# Patient Record
Sex: Male | Born: 1970
Health system: Southern US, Community
[De-identification: ages and names within clinical notes are randomized; demographics above are authoritative.]

## PROBLEM LIST (undated history)

## (undated) DIAGNOSIS — G709 Myoneural disorder, unspecified: Secondary | ICD-10-CM

## (undated) DIAGNOSIS — R569 Unspecified convulsions: Secondary | ICD-10-CM

## (undated) DIAGNOSIS — K635 Polyp of colon: Secondary | ICD-10-CM

## (undated) DIAGNOSIS — I10 Essential (primary) hypertension: Secondary | ICD-10-CM

## (undated) DIAGNOSIS — K219 Gastro-esophageal reflux disease without esophagitis: Secondary | ICD-10-CM

## (undated) DIAGNOSIS — M1990 Unspecified osteoarthritis, unspecified site: Secondary | ICD-10-CM

## (undated) DIAGNOSIS — S069X9A Unspecified intracranial injury with loss of consciousness of unspecified duration, initial encounter: Secondary | ICD-10-CM

## (undated) HISTORY — DX: Essential (primary) hypertension: I10

## (undated) HISTORY — DX: Polyp of colon: K63.5

## (undated) HISTORY — DX: Unspecified convulsions: R56.9

## (undated) HISTORY — DX: Gastro-esophageal reflux disease without esophagitis: K21.9

## (undated) HISTORY — DX: Unspecified intracranial injury with loss of consciousness of unspecified duration, initial encounter: S06.9X9A

## (undated) HISTORY — DX: Unspecified osteoarthritis, unspecified site: M19.90

## (undated) HISTORY — PX: BRAIN SURGERY: SHX531

---

## 1999-04-11 ENCOUNTER — Encounter: Payer: Self-pay | Admitting: Emergency Medicine

## 1999-04-11 ENCOUNTER — Inpatient Hospital Stay (HOSPITAL_COMMUNITY): Admission: EM | Admit: 1999-04-11 | Discharge: 1999-04-13 | Payer: Self-pay | Admitting: Emergency Medicine

## 1999-10-01 ENCOUNTER — Inpatient Hospital Stay (HOSPITAL_COMMUNITY)
Admission: EM | Admit: 1999-10-01 | Discharge: 1999-10-04 | Payer: Self-pay | Admitting: Physical Medicine and Rehabilitation

## 1999-10-01 ENCOUNTER — Encounter: Payer: Self-pay | Admitting: Physical Medicine and Rehabilitation

## 1999-10-04 ENCOUNTER — Inpatient Hospital Stay (HOSPITAL_COMMUNITY): Admission: AD | Admit: 1999-10-04 | Discharge: 1999-10-06 | Payer: Self-pay | Admitting: Pulmonary Disease

## 1999-10-04 ENCOUNTER — Encounter (INDEPENDENT_AMBULATORY_CARE_PROVIDER_SITE_OTHER): Payer: Self-pay | Admitting: *Deleted

## 1999-10-05 ENCOUNTER — Encounter: Payer: Self-pay | Admitting: Pulmonary Disease

## 1999-10-06 ENCOUNTER — Inpatient Hospital Stay (HOSPITAL_COMMUNITY)
Admission: AD | Admit: 1999-10-06 | Discharge: 1999-11-24 | Payer: Self-pay | Admitting: Physical Medicine and Rehabilitation

## 1999-10-07 ENCOUNTER — Encounter: Payer: Self-pay | Admitting: Physical Medicine & Rehabilitation

## 1999-10-15 ENCOUNTER — Encounter: Payer: Self-pay | Admitting: Physical Medicine and Rehabilitation

## 1999-10-18 ENCOUNTER — Encounter: Payer: Self-pay | Admitting: Physical Medicine and Rehabilitation

## 1999-10-22 ENCOUNTER — Encounter: Payer: Self-pay | Admitting: Physical Medicine and Rehabilitation
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
## 2017-05-12 ENCOUNTER — Ambulatory Visit (INDEPENDENT_AMBULATORY_CARE_PROVIDER_SITE_OTHER): Payer: Medicare Other | Admitting: General Surgery

## 2017-05-12 ENCOUNTER — Other Ambulatory Visit: Payer: Self-pay | Admitting: General Surgery

## 2017-05-12 ENCOUNTER — Encounter: Payer: Self-pay | Admitting: General Surgery

## 2017-05-12 VITALS — BP 162/89 | HR 76 | Temp 97.8°F | Resp 18 | Ht 73.0 in | Wt 230.0 lb

## 2017-05-12 DIAGNOSIS — D171 Benign lipomatous neoplasm of skin and subcutaneous tissue of trunk: Secondary | ICD-10-CM

## 2017-05-12 DIAGNOSIS — D179 Benign lipomatous neoplasm, unspecified: Secondary | ICD-10-CM

## 2017-05-12 NOTE — Progress Notes (Signed)
Michael Poole; [id number]; 1970-10-22   HPI Patient is a 46 year old white male who was referred by care of by Dr. Quillian Quince for evaluation and treatment of an enlarging mass on the top part of his back.  He states is been present for many years, but has recently increased in size and is causing him discomfort.  He was making difficult for him to lie on his back.  He currently has no pain.  He has noticed growth over the past year. No past medical history on file.  No past surgical history on file.  No family history on file.  No current outpatient prescriptions on file prior to visit.   No current facility-administered medications on file prior to visit.     Not on File  History  Alcohol use Not on file    History  Smoking Status  . Former Smoker  . Quit date: 05/10/2017  Smokeless Tobacco  . Never Used    Review of Systems  Constitutional: Negative.   HENT: Negative.   Eyes: Positive for blurred vision.  Respiratory: Negative.   Cardiovascular: Negative.   Gastrointestinal: Negative.   Genitourinary: Negative.   Musculoskeletal: Negative.   Skin: Negative.   Neurological: Negative.   Endo/Heme/Allergies: Negative.   Psychiatric/Behavioral: Negative.     Objective   Vitals:   05/12/17 1048  BP: (!) 162/89  Pulse: 76  Resp: 18  Temp: 97.8 F (36.6 C)    Physical Exam  Constitutional: He is oriented to person, place, and time and well-developed, well-nourished, and in no distress.  HENT:  Head: Normocephalic.  Cardiovascular: Normal rate, regular rhythm and normal heart sounds.   No murmur heard. Pulmonary/Chest: Effort normal and breath sounds normal. He has no wheezes. He has no rales.  Neurological: He is alert and oriented to person, place, and time.  Skin:  15 x 18 cm ovoid, rubbery, subcutaneous mass noted along the mid upper back, chest inferior to the neck.  It is nontender.  No drainage noted.  Vitals reviewed.  Primary care notes  reviewed. Assessment  Subcutaneous mass, back Plan     We will get an ultrasound of the mass to make sure it is just subcutaneous in nature.  He will return to go over results and surgical options.

## 2017-05-17 ENCOUNTER — Ambulatory Visit (HOSPITAL_COMMUNITY)
Admission: RE | Admit: 2017-05-17 | Discharge: 2017-05-17 | Disposition: A | Discharge status: Home / Self Care | Payer: Medicare Other | Source: Ambulatory Visit | Attending: General Surgery | Admitting: General Surgery

## 2017-05-17 DIAGNOSIS — D171 Benign lipomatous neoplasm of skin and subcutaneous tissue of trunk: Secondary | ICD-10-CM | POA: Diagnosis not present

## 2017-05-17 DIAGNOSIS — D179 Benign lipomatous neoplasm, unspecified: Secondary | ICD-10-CM

## 2017-05-19 ENCOUNTER — Encounter: Payer: Self-pay | Admitting: General Surgery

## 2017-05-19 ENCOUNTER — Ambulatory Visit (INDEPENDENT_AMBULATORY_CARE_PROVIDER_SITE_OTHER): Payer: Medicare Other | Admitting: General Surgery

## 2017-05-19 VITALS — BP 152/85 | HR 83 | Temp 97.7°F | Resp 18 | Ht 73.0 in | Wt 228.0 lb

## 2017-05-19 DIAGNOSIS — D171 Benign lipomatous neoplasm of skin and subcutaneous tissue of trunk: Secondary | ICD-10-CM

## 2017-05-19 NOTE — Progress Notes (Signed)
Subjective:     Michael Poole  Patient back for review of ultrasound. Objective:    BP (!) 152/85   Pulse 83   Temp 97.7 F (36.5 C)   Resp 18   Ht 6\' 1"  (1.854 m)   Wt 228 lb (103.4 kg)   BMI 30.08 kg/m   General:  alert, cooperative and no distress  Ultrasound shows the mass to be contained in the subcutaneous tissue. There does not appear to be extension into the muscle.     Assessment:    Large lipomatous mass, back and base of neck    Plan:   I am referring patient to [facility] as this is outside my realm of expertise in dealing with it at [facility]. Family understand and agreed. Will send records.

## 2017-05-23 DIAGNOSIS — F17200 Nicotine dependence, unspecified, uncomplicated: Secondary | ICD-10-CM | POA: Diagnosis not present

## 2017-05-23 DIAGNOSIS — R221 Localized swelling, mass and lump, neck: Secondary | ICD-10-CM | POA: Diagnosis not present

## 2017-06-01 DIAGNOSIS — G9389 Other specified disorders of brain: Secondary | ICD-10-CM | POA: Diagnosis not present

## 2017-06-01 DIAGNOSIS — R221 Localized swelling, mass and lump, neck: Secondary | ICD-10-CM | POA: Diagnosis not present

## 2017-06-08 DIAGNOSIS — R221 Localized swelling, mass and lump, neck: Secondary | ICD-10-CM | POA: Diagnosis not present

## 2017-06-08 DIAGNOSIS — D170 Benign lipomatous neoplasm of skin and subcutaneous tissue of head, face and neck: Secondary | ICD-10-CM | POA: Diagnosis not present

## 2017-06-10 DIAGNOSIS — D170 Benign lipomatous neoplasm of skin and subcutaneous tissue of head, face and neck: Secondary | ICD-10-CM | POA: Diagnosis not present

## 2017-06-10 DIAGNOSIS — R221 Localized swelling, mass and lump, neck: Secondary | ICD-10-CM | POA: Diagnosis not present

## 2017-06-10 DIAGNOSIS — F17200 Nicotine dependence, unspecified, uncomplicated: Secondary | ICD-10-CM | POA: Diagnosis not present

## 2017-06-17 DIAGNOSIS — F17210 Nicotine dependence, cigarettes, uncomplicated: Secondary | ICD-10-CM | POA: Diagnosis not present

## 2017-06-17 DIAGNOSIS — Z9889 Other specified postprocedural states: Secondary | ICD-10-CM | POA: Diagnosis not present

## 2017-06-17 DIAGNOSIS — Z87820 Personal history of traumatic brain injury: Secondary | ICD-10-CM | POA: Diagnosis not present

## 2017-06-17 DIAGNOSIS — R222 Localized swelling, mass and lump, trunk: Secondary | ICD-10-CM | POA: Diagnosis not present

## 2017-06-27 DIAGNOSIS — R222 Localized swelling, mass and lump, trunk: Secondary | ICD-10-CM | POA: Diagnosis not present

## 2017-06-27 DIAGNOSIS — D170 Benign lipomatous neoplasm of skin and subcutaneous tissue of head, face and neck: Secondary | ICD-10-CM | POA: Diagnosis not present

## 2017-06-27 DIAGNOSIS — Z9889 Other specified postprocedural states: Secondary | ICD-10-CM | POA: Diagnosis not present

## 2017-06-27 DIAGNOSIS — F17210 Nicotine dependence, cigarettes, uncomplicated: Secondary | ICD-10-CM | POA: Diagnosis not present

## 2017-06-27 DIAGNOSIS — D171 Benign lipomatous neoplasm of skin and subcutaneous tissue of trunk: Secondary | ICD-10-CM | POA: Diagnosis not present

## 2017-06-27 DIAGNOSIS — Z87820 Personal history of traumatic brain injury: Secondary | ICD-10-CM | POA: Diagnosis not present

## 2017-06-27 DIAGNOSIS — R221 Localized swelling, mass and lump, neck: Secondary | ICD-10-CM | POA: Diagnosis not present

## 2017-07-16 DIAGNOSIS — F17200 Nicotine dependence, unspecified, uncomplicated: Secondary | ICD-10-CM | POA: Diagnosis not present

## 2017-07-16 DIAGNOSIS — T85698A Other mechanical complication of other specified internal prosthetic devices, implants and grafts, initial encounter: Secondary | ICD-10-CM | POA: Diagnosis not present

## 2017-07-22 DIAGNOSIS — Z4803 Encounter for change or removal of drains: Secondary | ICD-10-CM | POA: Diagnosis not present

## 2017-07-22 DIAGNOSIS — Z48817 Encounter for surgical aftercare following surgery on the skin and subcutaneous tissue: Secondary | ICD-10-CM | POA: Diagnosis not present

## 2017-09-28 DIAGNOSIS — B351 Tinea unguium: Secondary | ICD-10-CM | POA: Diagnosis not present

## 2017-09-28 DIAGNOSIS — M25562 Pain in left knee: Secondary | ICD-10-CM | POA: Diagnosis not present

## 2018-02-01 DIAGNOSIS — Z6829 Body mass index (BMI) 29.0-29.9, adult: Secondary | ICD-10-CM | POA: Diagnosis not present

## 2018-02-01 DIAGNOSIS — J309 Allergic rhinitis, unspecified: Secondary | ICD-10-CM | POA: Diagnosis not present

## 2018-02-01 DIAGNOSIS — M62830 Muscle spasm of back: Secondary | ICD-10-CM | POA: Diagnosis not present

## 2018-02-01 DIAGNOSIS — M545 Low back pain: Secondary | ICD-10-CM | POA: Diagnosis not present

## 2018-03-22 DIAGNOSIS — Z6828 Body mass index (BMI) 28.0-28.9, adult: Secondary | ICD-10-CM | POA: Diagnosis not present

## 2018-03-22 DIAGNOSIS — Z0000 Encounter for general adult medical examination without abnormal findings: Secondary | ICD-10-CM | POA: Diagnosis not present

## 2018-03-22 DIAGNOSIS — R5383 Other fatigue: Secondary | ICD-10-CM | POA: Diagnosis not present

## 2018-03-22 DIAGNOSIS — I10 Essential (primary) hypertension: Secondary | ICD-10-CM | POA: Diagnosis not present

## 2018-03-22 DIAGNOSIS — N401 Enlarged prostate with lower urinary tract symptoms: Secondary | ICD-10-CM | POA: Diagnosis not present

## 2018-04-27 DIAGNOSIS — I10 Essential (primary) hypertension: Secondary | ICD-10-CM | POA: Diagnosis not present

## 2018-04-27 DIAGNOSIS — Z6828 Body mass index (BMI) 28.0-28.9, adult: Secondary | ICD-10-CM | POA: Diagnosis not present

## 2018-04-27 DIAGNOSIS — M25562 Pain in left knee: Secondary | ICD-10-CM | POA: Diagnosis not present

## 2018-05-03 DIAGNOSIS — M25562 Pain in left knee: Secondary | ICD-10-CM | POA: Diagnosis not present

## 2018-05-03 DIAGNOSIS — M94262 Chondromalacia, left knee: Secondary | ICD-10-CM | POA: Diagnosis not present

## 2018-05-03 DIAGNOSIS — M25462 Effusion, left knee: Secondary | ICD-10-CM | POA: Diagnosis not present

## 2018-05-08 DIAGNOSIS — Z1389 Encounter for screening for other disorder: Secondary | ICD-10-CM | POA: Diagnosis not present

## 2018-05-08 DIAGNOSIS — S8992XA Unspecified injury of left lower leg, initial encounter: Secondary | ICD-10-CM | POA: Diagnosis not present

## 2018-05-08 DIAGNOSIS — Z6828 Body mass index (BMI) 28.0-28.9, adult: Secondary | ICD-10-CM | POA: Diagnosis not present

## 2018-05-08 DIAGNOSIS — I10 Essential (primary) hypertension: Secondary | ICD-10-CM | POA: Diagnosis not present

## 2018-05-08 DIAGNOSIS — Z1331 Encounter for screening for depression: Secondary | ICD-10-CM | POA: Diagnosis not present

## 2018-05-08 DIAGNOSIS — M25562 Pain in left knee: Secondary | ICD-10-CM | POA: Diagnosis not present

## 2018-05-16 DIAGNOSIS — M94262 Chondromalacia, left knee: Secondary | ICD-10-CM | POA: Diagnosis not present

## 2018-06-29 DIAGNOSIS — I10 Essential (primary) hypertension: Secondary | ICD-10-CM | POA: Diagnosis not present

## 2018-06-29 DIAGNOSIS — M25562 Pain in left knee: Secondary | ICD-10-CM | POA: Diagnosis not present

## 2018-06-29 DIAGNOSIS — Z6828 Body mass index (BMI) 28.0-28.9, adult: Secondary | ICD-10-CM | POA: Diagnosis not present

## 2018-06-29 DIAGNOSIS — S8992XA Unspecified injury of left lower leg, initial encounter: Secondary | ICD-10-CM | POA: Diagnosis not present

## 2018-09-25 DIAGNOSIS — Z6829 Body mass index (BMI) 29.0-29.9, adult: Secondary | ICD-10-CM | POA: Diagnosis not present

## 2018-09-25 DIAGNOSIS — I10 Essential (primary) hypertension: Secondary | ICD-10-CM | POA: Diagnosis not present

## 2019-03-08 IMAGING — US US CHEST/MEDIASTINUM
1 series · 14 of 14 positions shown · non-contrast
Comparison: None.

CLINICAL DATA: Lipoma.

EXAM:
CHEST ULTRASOUND

[Series 1: us chest/mediastinum · 0.10mm/px · 14 of 14 slices shown]
[im 1/14]
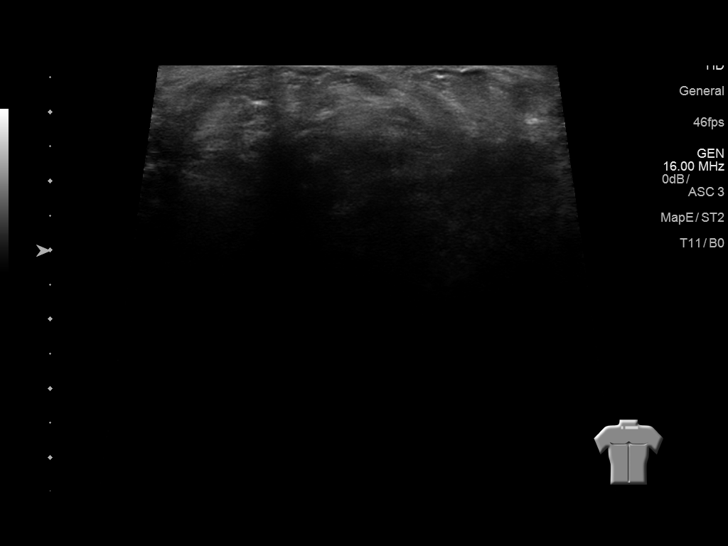
[im 2/14]
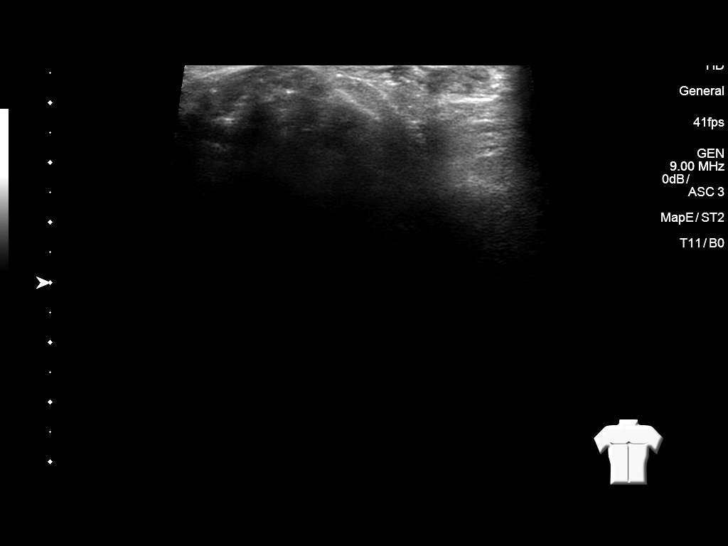
[im 3/14]
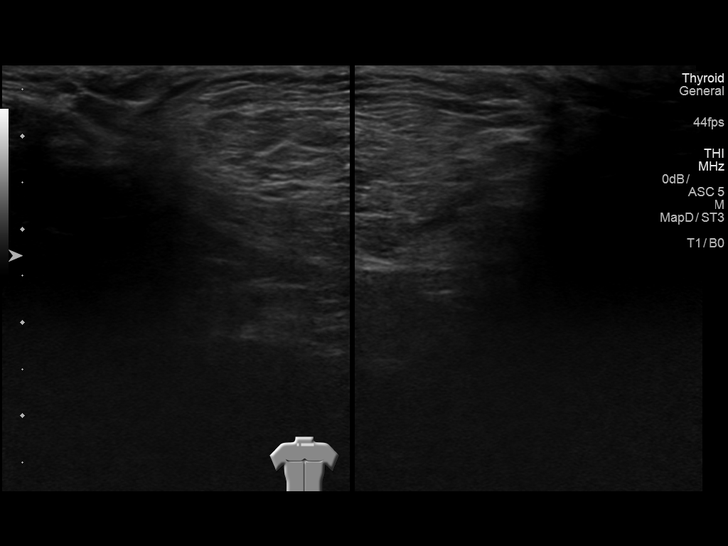
[im 4/14]
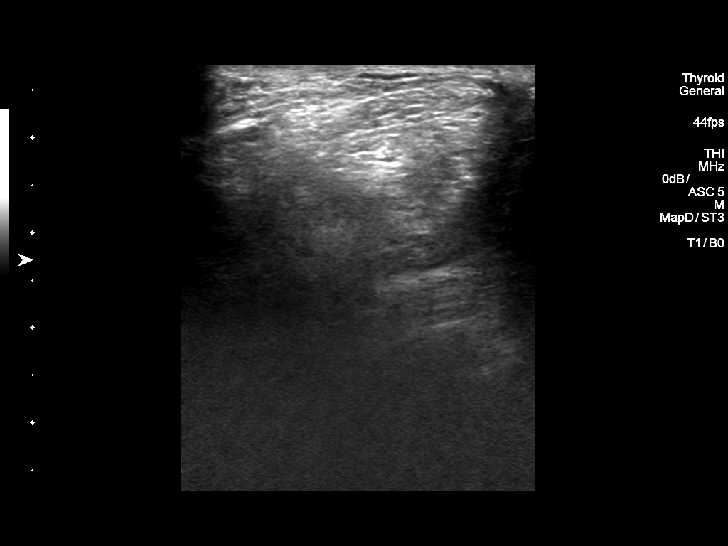
[im 5/14]
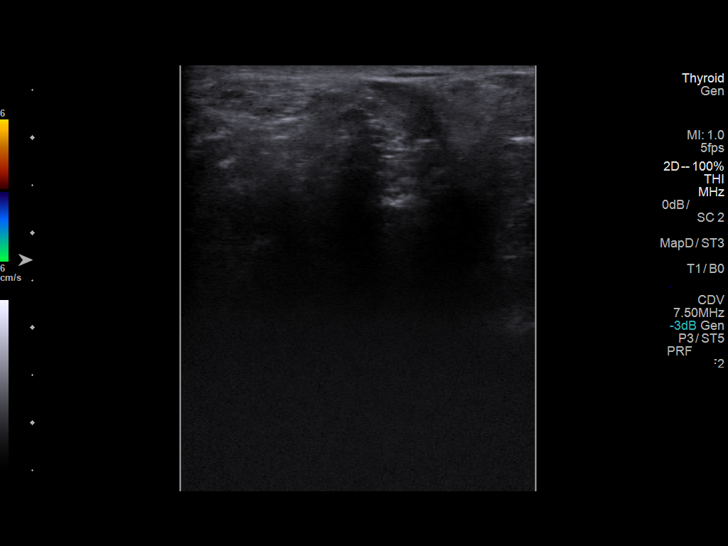
[im 6/14]
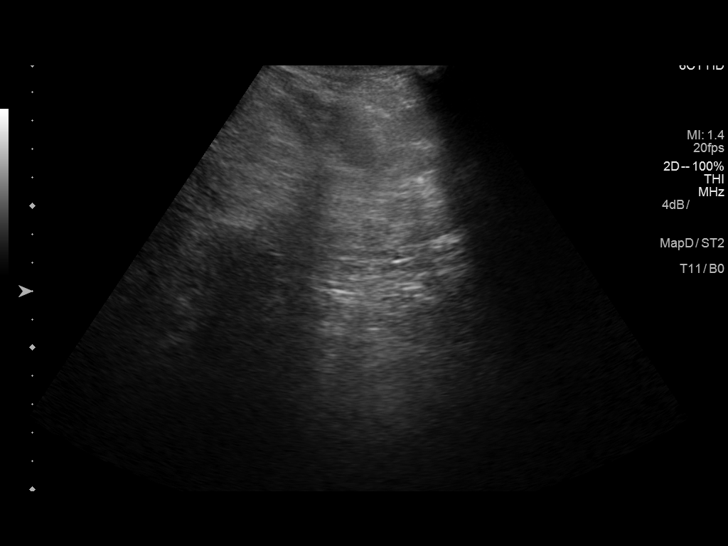
[im 7/14]
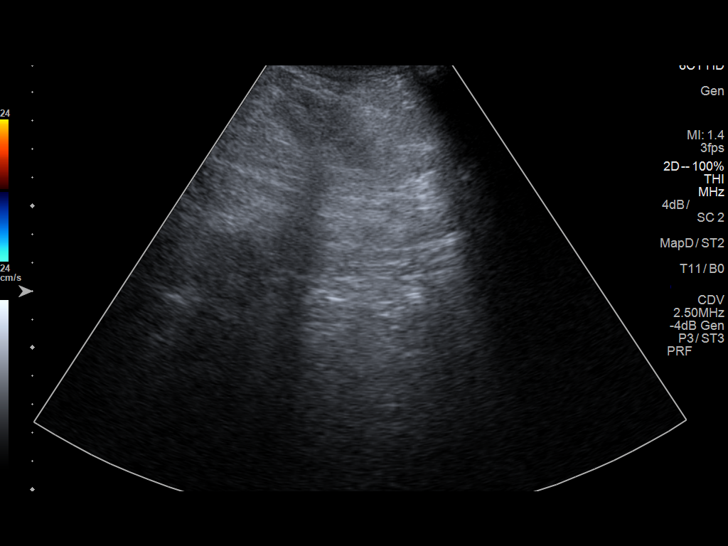
[im 8/14]
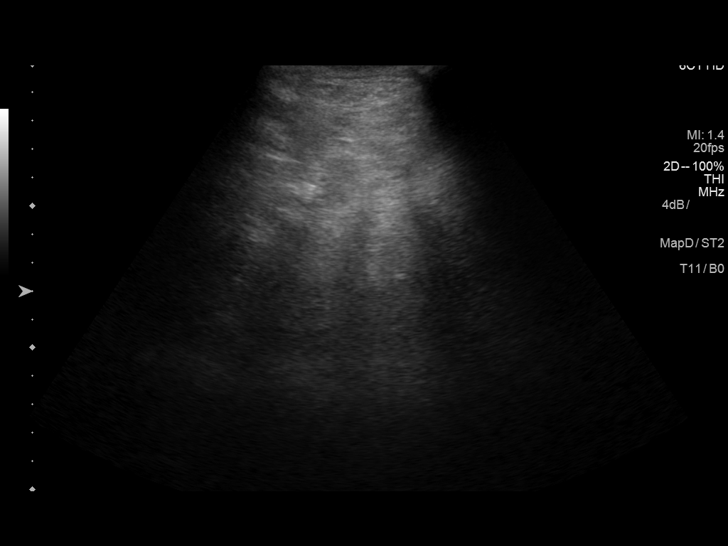
[im 9/14]
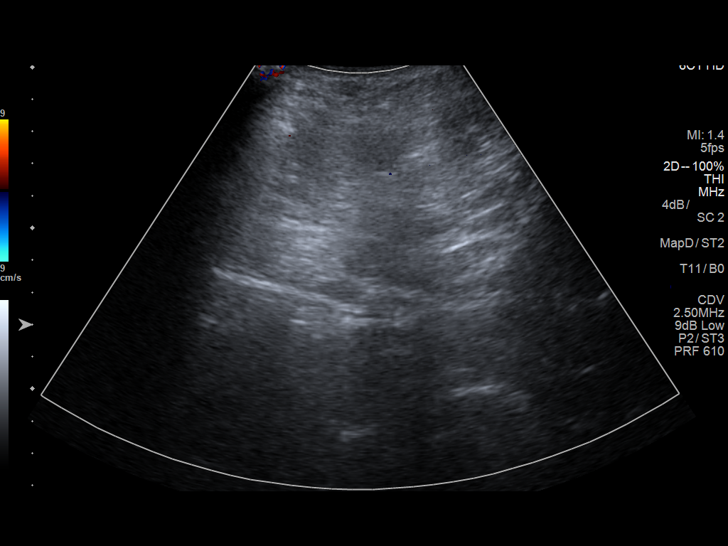
[im 10/14]
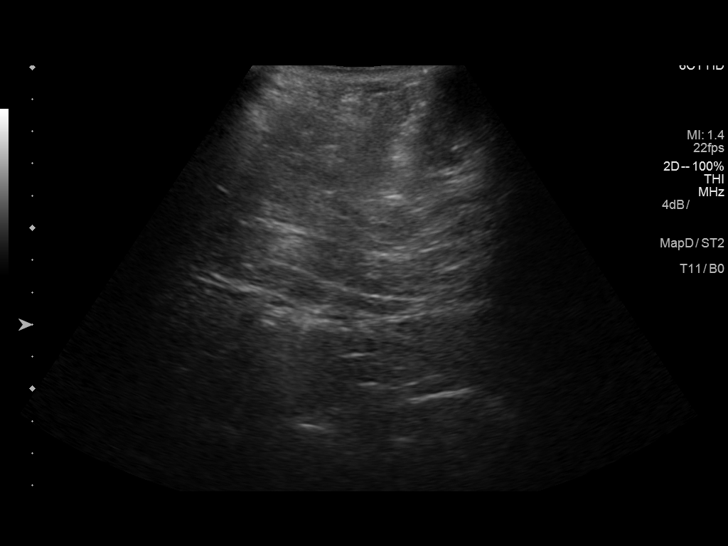
[im 11/14]
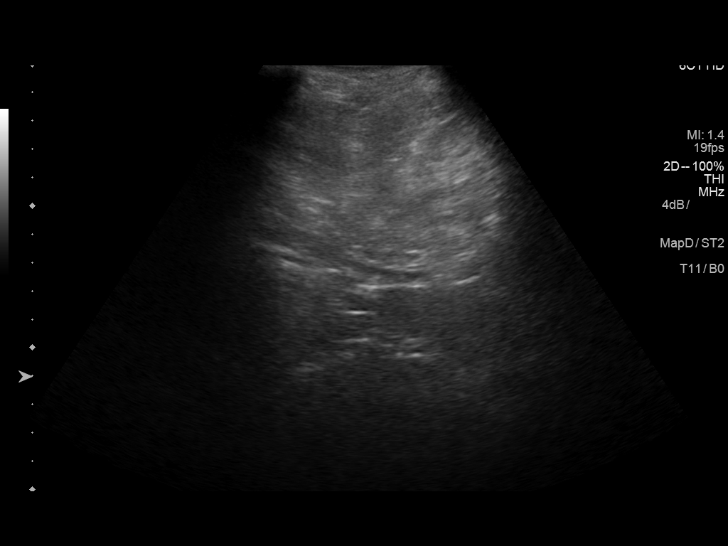
[im 12/14]
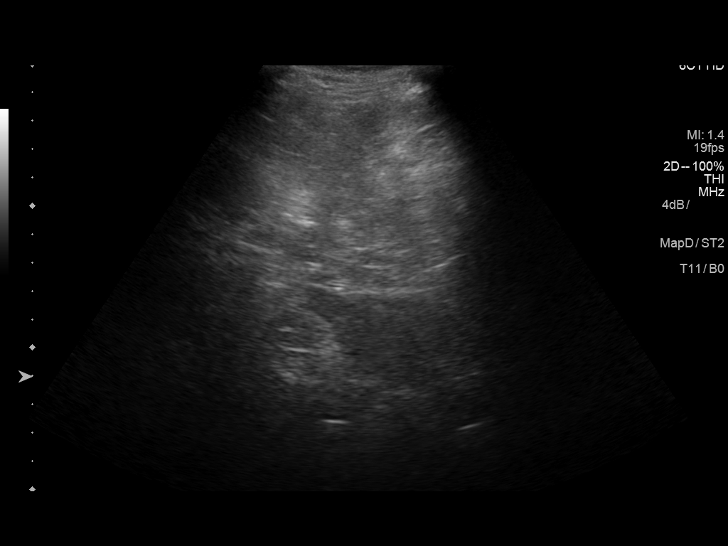
[im 13/14]
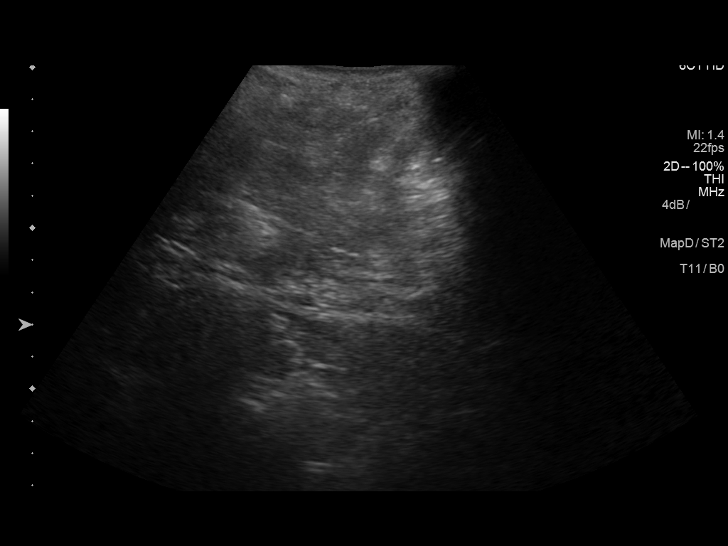
[im 14/14]
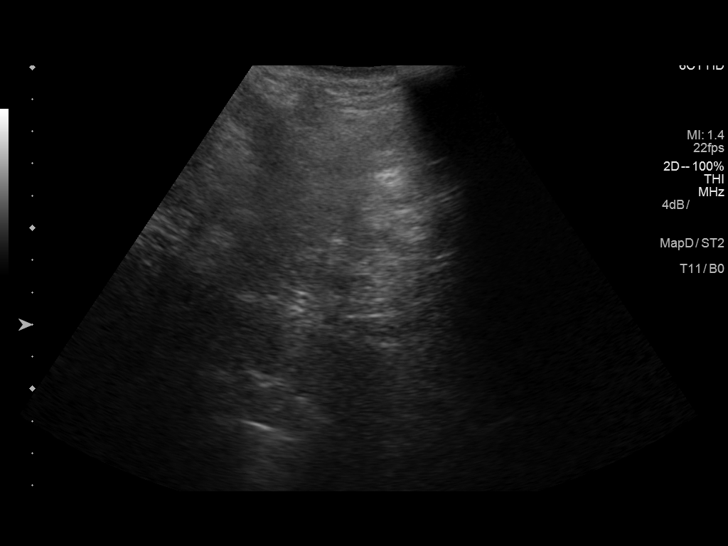

[14 of 14 positions shown; findings below may reference images not displayed]

FINDINGS: There is a clinically soft subcutaneous mass in the upper midline
back that was too large to be seen on a single image, at least 12 cm
in size. The mass appears to respect the deep fascia/ back muscles.
No internal vascularity or fluid collections noted. A lipogenic mass
is favored. Patient reports it has been present for at least 10
years.
IMPRESSION: Subcutaneous mass in the midline upper back with large size limiting
complete visualization by sonography. A fatty mass is favored. No
visible intramuscular component, but CT or MRI would be much more
sensitive in evaluating for deep space extension or complexity.

## 2019-11-02 DIAGNOSIS — F17210 Nicotine dependence, cigarettes, uncomplicated: Secondary | ICD-10-CM | POA: Diagnosis not present

## 2019-11-02 DIAGNOSIS — Z299 Encounter for prophylactic measures, unspecified: Secondary | ICD-10-CM | POA: Diagnosis not present

## 2019-11-02 DIAGNOSIS — M25562 Pain in left knee: Secondary | ICD-10-CM | POA: Diagnosis not present

## 2019-11-02 DIAGNOSIS — I10 Essential (primary) hypertension: Secondary | ICD-10-CM | POA: Diagnosis not present

## 2019-11-02 DIAGNOSIS — M25569 Pain in unspecified knee: Secondary | ICD-10-CM | POA: Diagnosis not present

## 2019-11-08 DIAGNOSIS — Z03818 Encounter for observation for suspected exposure to other biological agents ruled out: Secondary | ICD-10-CM | POA: Diagnosis not present

## 2019-12-03 DIAGNOSIS — M1711 Unilateral primary osteoarthritis, right knee: Secondary | ICD-10-CM | POA: Diagnosis not present

## 2019-12-03 DIAGNOSIS — M25569 Pain in unspecified knee: Secondary | ICD-10-CM | POA: Diagnosis not present

## 2019-12-03 DIAGNOSIS — I10 Essential (primary) hypertension: Secondary | ICD-10-CM | POA: Diagnosis not present

## 2019-12-03 DIAGNOSIS — Z299 Encounter for prophylactic measures, unspecified: Secondary | ICD-10-CM | POA: Diagnosis not present

## 2019-12-17 DIAGNOSIS — M1711 Unilateral primary osteoarthritis, right knee: Secondary | ICD-10-CM | POA: Diagnosis not present

## 2019-12-20 DIAGNOSIS — M1712 Unilateral primary osteoarthritis, left knee: Secondary | ICD-10-CM | POA: Diagnosis not present

## 2019-12-24 DIAGNOSIS — M1711 Unilateral primary osteoarthritis, right knee: Secondary | ICD-10-CM | POA: Diagnosis not present

## 2019-12-28 DIAGNOSIS — M1711 Unilateral primary osteoarthritis, right knee: Secondary | ICD-10-CM | POA: Diagnosis not present

## 2019-12-31 DIAGNOSIS — M1711 Unilateral primary osteoarthritis, right knee: Secondary | ICD-10-CM | POA: Diagnosis not present

## 2020-01-03 DIAGNOSIS — M1712 Unilateral primary osteoarthritis, left knee: Secondary | ICD-10-CM | POA: Diagnosis not present

## 2020-01-07 DIAGNOSIS — M1711 Unilateral primary osteoarthritis, right knee: Secondary | ICD-10-CM | POA: Diagnosis not present

## 2020-01-11 DIAGNOSIS — M1712 Unilateral primary osteoarthritis, left knee: Secondary | ICD-10-CM | POA: Diagnosis not present

## 2020-03-20 DIAGNOSIS — I10 Essential (primary) hypertension: Secondary | ICD-10-CM | POA: Diagnosis not present

## 2020-03-20 DIAGNOSIS — Z299 Encounter for prophylactic measures, unspecified: Secondary | ICD-10-CM | POA: Diagnosis not present

## 2020-03-20 DIAGNOSIS — Z7189 Other specified counseling: Secondary | ICD-10-CM | POA: Diagnosis not present

## 2020-03-20 DIAGNOSIS — Z0000 Encounter for general adult medical examination without abnormal findings: Secondary | ICD-10-CM | POA: Diagnosis not present

## 2020-03-20 DIAGNOSIS — Z79899 Other long term (current) drug therapy: Secondary | ICD-10-CM | POA: Diagnosis not present

## 2020-03-20 DIAGNOSIS — R5383 Other fatigue: Secondary | ICD-10-CM | POA: Diagnosis not present

## 2021-03-26 DIAGNOSIS — Z7189 Other specified counseling: Secondary | ICD-10-CM | POA: Diagnosis not present

## 2021-03-26 DIAGNOSIS — Z299 Encounter for prophylactic measures, unspecified: Secondary | ICD-10-CM | POA: Diagnosis not present

## 2021-03-26 DIAGNOSIS — I10 Essential (primary) hypertension: Secondary | ICD-10-CM | POA: Diagnosis not present

## 2021-03-26 DIAGNOSIS — Z79899 Other long term (current) drug therapy: Secondary | ICD-10-CM | POA: Diagnosis not present

## 2021-03-26 DIAGNOSIS — Z0000 Encounter for general adult medical examination without abnormal findings: Secondary | ICD-10-CM | POA: Diagnosis not present

## 2021-03-26 DIAGNOSIS — R5383 Other fatigue: Secondary | ICD-10-CM | POA: Diagnosis not present

## 2021-03-26 DIAGNOSIS — F17210 Nicotine dependence, cigarettes, uncomplicated: Secondary | ICD-10-CM | POA: Diagnosis not present

## 2021-05-14 DIAGNOSIS — I10 Essential (primary) hypertension: Secondary | ICD-10-CM | POA: Diagnosis not present

## 2021-05-14 DIAGNOSIS — Z299 Encounter for prophylactic measures, unspecified: Secondary | ICD-10-CM | POA: Diagnosis not present

## 2021-05-14 DIAGNOSIS — R61 Generalized hyperhidrosis: Secondary | ICD-10-CM | POA: Diagnosis not present

## 2021-06-18 DIAGNOSIS — Z299 Encounter for prophylactic measures, unspecified: Secondary | ICD-10-CM | POA: Diagnosis not present

## 2021-06-18 DIAGNOSIS — R413 Other amnesia: Secondary | ICD-10-CM | POA: Diagnosis not present

## 2021-06-18 DIAGNOSIS — I10 Essential (primary) hypertension: Secondary | ICD-10-CM | POA: Diagnosis not present

## 2021-06-18 DIAGNOSIS — F17210 Nicotine dependence, cigarettes, uncomplicated: Secondary | ICD-10-CM | POA: Diagnosis not present

## 2021-07-08 DIAGNOSIS — L74510 Primary focal hyperhidrosis, axilla: Secondary | ICD-10-CM | POA: Diagnosis not present

## 2021-07-14 DIAGNOSIS — J069 Acute upper respiratory infection, unspecified: Secondary | ICD-10-CM | POA: Diagnosis not present

## 2021-07-14 DIAGNOSIS — Z299 Encounter for prophylactic measures, unspecified: Secondary | ICD-10-CM | POA: Diagnosis not present

## 2021-07-14 DIAGNOSIS — M1712 Unilateral primary osteoarthritis, left knee: Secondary | ICD-10-CM | POA: Diagnosis not present

## 2021-07-14 DIAGNOSIS — I10 Essential (primary) hypertension: Secondary | ICD-10-CM | POA: Diagnosis not present

## 2021-07-14 DIAGNOSIS — M1711 Unilateral primary osteoarthritis, right knee: Secondary | ICD-10-CM | POA: Diagnosis not present

## 2021-08-11 DIAGNOSIS — R61 Generalized hyperhidrosis: Secondary | ICD-10-CM | POA: Diagnosis not present

## 2021-08-11 DIAGNOSIS — Z299 Encounter for prophylactic measures, unspecified: Secondary | ICD-10-CM | POA: Diagnosis not present

## 2021-08-11 DIAGNOSIS — Z2821 Immunization not carried out because of patient refusal: Secondary | ICD-10-CM | POA: Diagnosis not present

## 2021-08-11 DIAGNOSIS — I10 Essential (primary) hypertension: Secondary | ICD-10-CM | POA: Diagnosis not present

## 2021-08-11 DIAGNOSIS — K5900 Constipation, unspecified: Secondary | ICD-10-CM | POA: Diagnosis not present

## 2021-08-11 DIAGNOSIS — M25462 Effusion, left knee: Secondary | ICD-10-CM | POA: Diagnosis not present

## 2021-09-18 DIAGNOSIS — Z299 Encounter for prophylactic measures, unspecified: Secondary | ICD-10-CM | POA: Diagnosis not present

## 2021-09-18 DIAGNOSIS — I10 Essential (primary) hypertension: Secondary | ICD-10-CM | POA: Diagnosis not present

## 2021-09-18 DIAGNOSIS — K649 Unspecified hemorrhoids: Secondary | ICD-10-CM | POA: Diagnosis not present

## 2021-09-18 DIAGNOSIS — F17210 Nicotine dependence, cigarettes, uncomplicated: Secondary | ICD-10-CM | POA: Diagnosis not present

## 2021-10-03 DIAGNOSIS — S99921A Unspecified injury of right foot, initial encounter: Secondary | ICD-10-CM | POA: Diagnosis not present

## 2021-10-03 DIAGNOSIS — W208XXA Other cause of strike by thrown, projected or falling object, initial encounter: Secondary | ICD-10-CM | POA: Diagnosis not present

## 2021-10-03 DIAGNOSIS — S90111A Contusion of right great toe without damage to nail, initial encounter: Secondary | ICD-10-CM | POA: Diagnosis not present

## 2021-10-07 DIAGNOSIS — K648 Other hemorrhoids: Secondary | ICD-10-CM | POA: Diagnosis not present

## 2021-10-22 DIAGNOSIS — Z299 Encounter for prophylactic measures, unspecified: Secondary | ICD-10-CM | POA: Diagnosis not present

## 2021-10-22 DIAGNOSIS — I10 Essential (primary) hypertension: Secondary | ICD-10-CM | POA: Diagnosis not present

## 2021-10-22 DIAGNOSIS — M109 Gout, unspecified: Secondary | ICD-10-CM | POA: Diagnosis not present

## 2021-10-22 DIAGNOSIS — Z87891 Personal history of nicotine dependence: Secondary | ICD-10-CM | POA: Diagnosis not present

## 2021-11-04 DIAGNOSIS — R61 Generalized hyperhidrosis: Secondary | ICD-10-CM | POA: Diagnosis not present

## 2021-11-04 DIAGNOSIS — F17210 Nicotine dependence, cigarettes, uncomplicated: Secondary | ICD-10-CM | POA: Diagnosis not present

## 2021-11-04 DIAGNOSIS — Z299 Encounter for prophylactic measures, unspecified: Secondary | ICD-10-CM | POA: Diagnosis not present

## 2021-11-04 DIAGNOSIS — I10 Essential (primary) hypertension: Secondary | ICD-10-CM | POA: Diagnosis not present

## 2021-11-12 DIAGNOSIS — K649 Unspecified hemorrhoids: Secondary | ICD-10-CM | POA: Diagnosis not present

## 2021-11-12 DIAGNOSIS — I10 Essential (primary) hypertension: Secondary | ICD-10-CM | POA: Diagnosis not present

## 2021-11-12 DIAGNOSIS — K219 Gastro-esophageal reflux disease without esophagitis: Secondary | ICD-10-CM | POA: Diagnosis not present

## 2021-11-12 DIAGNOSIS — K641 Second degree hemorrhoids: Secondary | ICD-10-CM | POA: Diagnosis not present

## 2021-11-12 DIAGNOSIS — Z1211 Encounter for screening for malignant neoplasm of colon: Secondary | ICD-10-CM | POA: Diagnosis not present

## 2021-11-12 DIAGNOSIS — Z79899 Other long term (current) drug therapy: Secondary | ICD-10-CM | POA: Diagnosis not present

## 2021-11-12 HISTORY — PX: COLONOSCOPY: SHX174

## 2021-12-02 DIAGNOSIS — F17210 Nicotine dependence, cigarettes, uncomplicated: Secondary | ICD-10-CM | POA: Diagnosis not present

## 2021-12-02 DIAGNOSIS — J029 Acute pharyngitis, unspecified: Secondary | ICD-10-CM | POA: Diagnosis not present

## 2021-12-02 DIAGNOSIS — J039 Acute tonsillitis, unspecified: Secondary | ICD-10-CM | POA: Diagnosis not present

## 2021-12-02 DIAGNOSIS — Z299 Encounter for prophylactic measures, unspecified: Secondary | ICD-10-CM | POA: Diagnosis not present

## 2021-12-02 DIAGNOSIS — J36 Peritonsillar abscess: Secondary | ICD-10-CM | POA: Diagnosis not present

## 2021-12-03 DIAGNOSIS — J039 Acute tonsillitis, unspecified: Secondary | ICD-10-CM | POA: Diagnosis not present

## 2021-12-28 DIAGNOSIS — K648 Other hemorrhoids: Secondary | ICD-10-CM | POA: Diagnosis not present

## 2021-12-31 DIAGNOSIS — M25371 Other instability, right ankle: Secondary | ICD-10-CM | POA: Diagnosis not present

## 2021-12-31 DIAGNOSIS — M25372 Other instability, left ankle: Secondary | ICD-10-CM | POA: Diagnosis not present

## 2022-01-13 DIAGNOSIS — Z7189 Other specified counseling: Secondary | ICD-10-CM | POA: Diagnosis not present

## 2022-01-13 DIAGNOSIS — Z0000 Encounter for general adult medical examination without abnormal findings: Secondary | ICD-10-CM | POA: Diagnosis not present

## 2022-01-13 DIAGNOSIS — I10 Essential (primary) hypertension: Secondary | ICD-10-CM | POA: Diagnosis not present

## 2022-01-13 DIAGNOSIS — Z299 Encounter for prophylactic measures, unspecified: Secondary | ICD-10-CM | POA: Diagnosis not present

## 2022-01-13 DIAGNOSIS — F17210 Nicotine dependence, cigarettes, uncomplicated: Secondary | ICD-10-CM | POA: Diagnosis not present

## 2022-01-14 DIAGNOSIS — S93411S Sprain of calcaneofibular ligament of right ankle, sequela: Secondary | ICD-10-CM | POA: Diagnosis not present

## 2022-01-14 DIAGNOSIS — Z87820 Personal history of traumatic brain injury: Secondary | ICD-10-CM | POA: Diagnosis not present

## 2022-01-14 DIAGNOSIS — S93491S Sprain of other ligament of right ankle, sequela: Secondary | ICD-10-CM | POA: Diagnosis not present

## 2022-01-14 DIAGNOSIS — M25371 Other instability, right ankle: Secondary | ICD-10-CM | POA: Diagnosis not present

## 2022-01-14 DIAGNOSIS — Z720 Tobacco use: Secondary | ICD-10-CM | POA: Diagnosis not present

## 2022-01-19 DIAGNOSIS — S93411D Sprain of calcaneofibular ligament of right ankle, subsequent encounter: Secondary | ICD-10-CM | POA: Diagnosis not present

## 2022-02-04 DIAGNOSIS — M12571 Traumatic arthropathy, right ankle and foot: Secondary | ICD-10-CM | POA: Diagnosis not present

## 2022-02-04 DIAGNOSIS — M93271 Osteochondritis dissecans, right ankle and joints of right foot: Secondary | ICD-10-CM | POA: Diagnosis not present

## 2022-02-04 DIAGNOSIS — S93491S Sprain of other ligament of right ankle, sequela: Secondary | ICD-10-CM | POA: Diagnosis not present

## 2022-02-04 DIAGNOSIS — M25371 Other instability, right ankle: Secondary | ICD-10-CM | POA: Diagnosis not present

## 2022-02-04 DIAGNOSIS — S93411S Sprain of calcaneofibular ligament of right ankle, sequela: Secondary | ICD-10-CM | POA: Diagnosis not present

## 2022-02-04 DIAGNOSIS — M7671 Peroneal tendinitis, right leg: Secondary | ICD-10-CM | POA: Diagnosis not present

## 2022-03-01 DIAGNOSIS — K648 Other hemorrhoids: Secondary | ICD-10-CM | POA: Diagnosis not present

## 2022-03-18 HISTORY — PX: HEMORRHOID BANDING: SHX5850

## 2022-03-29 DIAGNOSIS — Z0000 Encounter for general adult medical examination without abnormal findings: Secondary | ICD-10-CM | POA: Diagnosis not present

## 2022-03-29 DIAGNOSIS — Z299 Encounter for prophylactic measures, unspecified: Secondary | ICD-10-CM | POA: Diagnosis not present

## 2022-03-29 DIAGNOSIS — I10 Essential (primary) hypertension: Secondary | ICD-10-CM | POA: Diagnosis not present

## 2022-03-29 DIAGNOSIS — R5383 Other fatigue: Secondary | ICD-10-CM | POA: Diagnosis not present

## 2022-03-29 DIAGNOSIS — F17210 Nicotine dependence, cigarettes, uncomplicated: Secondary | ICD-10-CM | POA: Diagnosis not present

## 2022-03-29 DIAGNOSIS — Z79899 Other long term (current) drug therapy: Secondary | ICD-10-CM | POA: Diagnosis not present

## 2022-04-14 DIAGNOSIS — K648 Other hemorrhoids: Secondary | ICD-10-CM | POA: Diagnosis not present

## 2022-04-16 DIAGNOSIS — I10 Essential (primary) hypertension: Secondary | ICD-10-CM | POA: Diagnosis not present

## 2022-04-16 DIAGNOSIS — Z79899 Other long term (current) drug therapy: Secondary | ICD-10-CM | POA: Diagnosis not present

## 2022-04-16 DIAGNOSIS — K219 Gastro-esophageal reflux disease without esophagitis: Secondary | ICD-10-CM | POA: Diagnosis not present

## 2022-04-16 DIAGNOSIS — K648 Other hemorrhoids: Secondary | ICD-10-CM | POA: Diagnosis not present

## 2022-04-16 DIAGNOSIS — K640 First degree hemorrhoids: Secondary | ICD-10-CM | POA: Diagnosis not present

## 2022-05-31 DIAGNOSIS — K5904 Chronic idiopathic constipation: Secondary | ICD-10-CM | POA: Diagnosis not present

## 2022-05-31 DIAGNOSIS — K648 Other hemorrhoids: Secondary | ICD-10-CM | POA: Diagnosis not present

## 2022-09-27 ENCOUNTER — Encounter: Payer: Self-pay | Admitting: Gastroenterology

## 2022-11-15 ENCOUNTER — Ambulatory Visit: Payer: 59 | Admitting: Gastroenterology

## 2022-11-17 ENCOUNTER — Encounter: Payer: Self-pay | Admitting: Gastroenterology

## 2022-11-17 ENCOUNTER — Ambulatory Visit (INDEPENDENT_AMBULATORY_CARE_PROVIDER_SITE_OTHER): Payer: 59 | Admitting: Gastroenterology

## 2022-11-17 VITALS — BP 150/88 | HR 93 | Ht 73.0 in | Wt 227.0 lb

## 2022-11-17 DIAGNOSIS — K649 Unspecified hemorrhoids: Secondary | ICD-10-CM

## 2022-11-17 DIAGNOSIS — K5900 Constipation, unspecified: Secondary | ICD-10-CM

## 2022-11-17 MED ORDER — CALMOL-4 76-10 % RE SUPP: RECTAL | 0 refills | Status: AC

## 2022-11-17 MED ORDER — BENEFIBER PO POWD: ORAL | 0 refills | Status: AC
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
## 2022-11-17 NOTE — Patient Instructions (Addendum)
If you are age 50 or older, your body mass index should be between 23-30. Your Body mass index is 29.95 kg/m. If this is out of the aforementioned range listed, please consider follow up with your Primary Care Provider.  If you are age 34 or younger, your body mass index should be between 19-25. Your Body mass index is 29.95 kg/m. If this is out of the aformentioned range listed, please consider follow up with your Primary Care Provider.   ________________________________________________________   We are referring you to [facility]: [phone] . They will contact you directly to schedule an appointment.  It may take a week or more before you hear from them.  Please feel free to contact us if you have not heard from them within 2 weeks and we will follow up on the referral.    We have given you samples of the following medication to take: Calmol4 suppositories: use as directed  Please purchase the following medications over the counter and take as directed: Benefiber: Use daily  Thank you for entrusting me with your care and for choosing [employer], Dr. [place] Cellar

## 2022-11-22 ENCOUNTER — Telehealth: Payer: Self-pay

## 2022-11-22 NOTE — Telephone Encounter (Signed)
Patient referred to [facility] for hemorrhoids 11-18-22

## 2022-12-01 DIAGNOSIS — K642 Third degree hemorrhoids: Secondary | ICD-10-CM | POA: Diagnosis not present

## 2022-12-01 DIAGNOSIS — K644 Residual hemorrhoidal skin tags: Secondary | ICD-10-CM | POA: Diagnosis not present

## 2022-12-01 DIAGNOSIS — K5909 Other constipation: Secondary | ICD-10-CM | POA: Diagnosis not present

## 2022-12-01 DIAGNOSIS — K648 Other hemorrhoids: Secondary | ICD-10-CM | POA: Diagnosis not present

## 2022-12-01 DIAGNOSIS — R413 Other amnesia: Secondary | ICD-10-CM | POA: Diagnosis not present

## 2022-12-01 DIAGNOSIS — Z87828 Personal history of other (healed) physical injury and trauma: Secondary | ICD-10-CM | POA: Diagnosis not present

## 2023-01-17 DIAGNOSIS — Z0000 Encounter for general adult medical examination without abnormal findings: Secondary | ICD-10-CM | POA: Diagnosis not present

## 2023-01-17 DIAGNOSIS — F17210 Nicotine dependence, cigarettes, uncomplicated: Secondary | ICD-10-CM | POA: Diagnosis not present

## 2023-01-17 DIAGNOSIS — Z79899 Other long term (current) drug therapy: Secondary | ICD-10-CM | POA: Diagnosis not present

## 2023-01-17 DIAGNOSIS — E7800 Pure hypercholesterolemia, unspecified: Secondary | ICD-10-CM | POA: Diagnosis not present

## 2023-01-17 DIAGNOSIS — Z7189 Other specified counseling: Secondary | ICD-10-CM | POA: Diagnosis not present

## 2023-01-17 DIAGNOSIS — R5383 Other fatigue: Secondary | ICD-10-CM | POA: Diagnosis not present

## 2023-01-17 DIAGNOSIS — Z299 Encounter for prophylactic measures, unspecified: Secondary | ICD-10-CM | POA: Diagnosis not present

## 2023-01-17 DIAGNOSIS — I10 Essential (primary) hypertension: Secondary | ICD-10-CM | POA: Diagnosis not present

## 2023-03-15 DIAGNOSIS — I771 Stricture of artery: Secondary | ICD-10-CM | POA: Diagnosis not present

## 2023-03-15 DIAGNOSIS — I10 Essential (primary) hypertension: Secondary | ICD-10-CM | POA: Diagnosis not present

## 2023-03-15 DIAGNOSIS — N189 Chronic kidney disease, unspecified: Secondary | ICD-10-CM | POA: Diagnosis not present

## 2023-03-15 DIAGNOSIS — E1122 Type 2 diabetes mellitus with diabetic chronic kidney disease: Secondary | ICD-10-CM | POA: Diagnosis not present

## 2023-03-15 DIAGNOSIS — I701 Atherosclerosis of renal artery: Secondary | ICD-10-CM | POA: Diagnosis not present

## 2023-03-15 DIAGNOSIS — K219 Gastro-esophageal reflux disease without esophagitis: Secondary | ICD-10-CM | POA: Diagnosis not present

## 2023-03-15 DIAGNOSIS — J90 Pleural effusion, not elsewhere classified: Secondary | ICD-10-CM | POA: Diagnosis not present

## 2023-03-15 DIAGNOSIS — S2020XA Contusion of thorax, unspecified, initial encounter: Secondary | ICD-10-CM | POA: Diagnosis not present

## 2023-03-15 DIAGNOSIS — K551 Chronic vascular disorders of intestine: Secondary | ICD-10-CM | POA: Diagnosis not present

## 2023-03-15 DIAGNOSIS — Z87891 Personal history of nicotine dependence: Secondary | ICD-10-CM | POA: Diagnosis not present

## 2023-03-15 DIAGNOSIS — F17210 Nicotine dependence, cigarettes, uncomplicated: Secondary | ICD-10-CM | POA: Diagnosis not present

## 2023-03-15 DIAGNOSIS — W1839XA Other fall on same level, initial encounter: Secondary | ICD-10-CM | POA: Diagnosis not present

## 2023-03-15 DIAGNOSIS — S20212A Contusion of left front wall of thorax, initial encounter: Secondary | ICD-10-CM | POA: Diagnosis not present

## 2023-03-15 DIAGNOSIS — I129 Hypertensive chronic kidney disease with stage 1 through stage 4 chronic kidney disease, or unspecified chronic kidney disease: Secondary | ICD-10-CM | POA: Diagnosis not present

## 2023-03-15 DIAGNOSIS — J449 Chronic obstructive pulmonary disease, unspecified: Secondary | ICD-10-CM | POA: Diagnosis not present

## 2023-03-15 DIAGNOSIS — Z79899 Other long term (current) drug therapy: Secondary | ICD-10-CM | POA: Diagnosis not present

## 2023-03-15 DIAGNOSIS — E785 Hyperlipidemia, unspecified: Secondary | ICD-10-CM | POA: Diagnosis not present

## 2023-03-15 DIAGNOSIS — Z87820 Personal history of traumatic brain injury: Secondary | ICD-10-CM | POA: Diagnosis not present

## 2023-03-15 DIAGNOSIS — R059 Cough, unspecified: Secondary | ICD-10-CM | POA: Diagnosis not present

## 2023-03-22 DIAGNOSIS — I10 Essential (primary) hypertension: Secondary | ICD-10-CM | POA: Diagnosis not present

## 2023-03-22 DIAGNOSIS — Z299 Encounter for prophylactic measures, unspecified: Secondary | ICD-10-CM | POA: Diagnosis not present

## 2023-03-22 DIAGNOSIS — R0789 Other chest pain: Secondary | ICD-10-CM | POA: Diagnosis not present

## 2023-03-22 DIAGNOSIS — F17210 Nicotine dependence, cigarettes, uncomplicated: Secondary | ICD-10-CM | POA: Diagnosis not present

## 2023-05-03 DIAGNOSIS — F17210 Nicotine dependence, cigarettes, uncomplicated: Secondary | ICD-10-CM | POA: Diagnosis not present

## 2023-05-03 DIAGNOSIS — I10 Essential (primary) hypertension: Secondary | ICD-10-CM | POA: Diagnosis not present

## 2023-05-03 DIAGNOSIS — Z0000 Encounter for general adult medical examination without abnormal findings: Secondary | ICD-10-CM | POA: Diagnosis not present

## 2023-05-03 DIAGNOSIS — Z299 Encounter for prophylactic measures, unspecified: Secondary | ICD-10-CM | POA: Diagnosis not present

## 2023-07-04 DIAGNOSIS — I10 Essential (primary) hypertension: Secondary | ICD-10-CM | POA: Diagnosis not present

## 2023-07-04 DIAGNOSIS — Z299 Encounter for prophylactic measures, unspecified: Secondary | ICD-10-CM | POA: Diagnosis not present

## 2023-07-04 DIAGNOSIS — F17210 Nicotine dependence, cigarettes, uncomplicated: Secondary | ICD-10-CM | POA: Diagnosis not present

## 2023-08-19 DIAGNOSIS — M109 Gout, unspecified: Secondary | ICD-10-CM | POA: Diagnosis not present

## 2023-08-19 DIAGNOSIS — M1712 Unilateral primary osteoarthritis, left knee: Secondary | ICD-10-CM | POA: Diagnosis not present

## 2023-09-05 DIAGNOSIS — Z299 Encounter for prophylactic measures, unspecified: Secondary | ICD-10-CM | POA: Diagnosis not present

## 2023-09-05 DIAGNOSIS — I10 Essential (primary) hypertension: Secondary | ICD-10-CM | POA: Diagnosis not present

## 2023-09-05 DIAGNOSIS — R5383 Other fatigue: Secondary | ICD-10-CM | POA: Diagnosis not present

## 2023-10-05 DIAGNOSIS — M25562 Pain in left knee: Secondary | ICD-10-CM | POA: Diagnosis not present

## 2024-01-23 DIAGNOSIS — I10 Essential (primary) hypertension: Secondary | ICD-10-CM | POA: Diagnosis not present

## 2024-01-23 DIAGNOSIS — Z0000 Encounter for general adult medical examination without abnormal findings: Secondary | ICD-10-CM | POA: Diagnosis not present

## 2024-01-23 DIAGNOSIS — Z299 Encounter for prophylactic measures, unspecified: Secondary | ICD-10-CM | POA: Diagnosis not present

## 2024-01-23 DIAGNOSIS — Z7189 Other specified counseling: Secondary | ICD-10-CM | POA: Diagnosis not present

## 2024-01-23 DIAGNOSIS — F17210 Nicotine dependence, cigarettes, uncomplicated: Secondary | ICD-10-CM | POA: Diagnosis not present

## 2024-05-03 DIAGNOSIS — Z0000 Encounter for general adult medical examination without abnormal findings: Secondary | ICD-10-CM | POA: Diagnosis not present

## 2024-05-03 DIAGNOSIS — Z79899 Other long term (current) drug therapy: Secondary | ICD-10-CM | POA: Diagnosis not present

## 2024-05-03 DIAGNOSIS — I10 Essential (primary) hypertension: Secondary | ICD-10-CM | POA: Diagnosis not present

## 2024-05-03 DIAGNOSIS — Z299 Encounter for prophylactic measures, unspecified: Secondary | ICD-10-CM | POA: Diagnosis not present

## 2024-05-03 DIAGNOSIS — E7800 Pure hypercholesterolemia, unspecified: Secondary | ICD-10-CM | POA: Diagnosis not present

## 2024-05-03 DIAGNOSIS — R5383 Other fatigue: Secondary | ICD-10-CM | POA: Diagnosis not present

## 2024-07-16 DIAGNOSIS — T560X1A Toxic effect of lead and its compounds, accidental (unintentional), initial encounter: Secondary | ICD-10-CM | POA: Diagnosis not present

## 2024-07-16 DIAGNOSIS — M25561 Pain in right knee: Secondary | ICD-10-CM | POA: Diagnosis not present

## 2024-07-16 DIAGNOSIS — M24461 Recurrent dislocation, right knee: Secondary | ICD-10-CM | POA: Diagnosis not present

## 2024-07-16 DIAGNOSIS — M25461 Effusion, right knee: Secondary | ICD-10-CM | POA: Diagnosis not present

## 2024-07-30 DIAGNOSIS — M170 Bilateral primary osteoarthritis of knee: Secondary | ICD-10-CM | POA: Diagnosis not present

## 2024-07-30 DIAGNOSIS — M1A0690 Idiopathic chronic gout, unspecified knee, without tophus (tophi): Secondary | ICD-10-CM | POA: Diagnosis not present

## 2024-10-17 NOTE — Progress Notes (Signed)
 Sent message, via epic in basket, requesting orders in epic from Careers adviser.

## 2024-10-22 NOTE — Progress Notes (Signed)
 PLEASE CHECK HIS BLOOD PRESSURE AT PAT APPOINTMENT AND NOTIFY ME IF ELEVATED!!  Patient very non-compliant with his BP medicines and at my in office pre-op visit with him on 10/17/24 his BP taken multiple times INCLUDING after watching him take his medicine in the office remained around 267/174. Thankfully showed no signs of distress or organ system dysfunction. I relayed my concerns to him about the potential of having a stroke. Did not seem appropriately concerned.  We immediately contacted his PCP to try to get them to better optimize his BP and they were willing to see him same day. Patient however refused to go despite several staff members telling him the importance of getting this managed and risks of it remaining so uncontrolled.   Unsure if he has yet to see his PCP Dr. Rosamond about this. I stressed to him that his surgery will be cancelled if he does not get his BP under better control by his PAT appointment and definitely the day of surgery.   Patient has a history of a TBI that has left him with some difficulty fully understanding things and is likely contributing to this.         Michael CHRISTELLA Large, PA-C Office [phone] 10/22/2024, 11:30 PM

## 2024-10-23 NOTE — Patient Instructions (Signed)
 SURGICAL WAITING ROOM VISITATION Patients having surgery or a procedure may have no more than 2 support people in the waiting area - these visitors may rotate in the visitor waiting room.   Due to an increase in RSV and influenza rates and associated hospitalizations, children ages 71 and under may not visit patients in [facility] hospitals. If the patient needs to stay at the hospital during part of their recovery, the visitor guidelines for inpatient rooms apply.  PRE-OP VISITATION  Pre-op nurse will coordinate an appropriate time for 1 support person to accompany the patient in pre-op.  This support person may not rotate.  This visitor will be contacted when the time is appropriate for the visitor to come back in the pre-op area.  Please refer to the [facility] website for the visitor guidelines for Inpatients (after your surgery is over and you are in a regular room).  You are not required to quarantine at this time prior to your surgery. However, you must do this: Hand Hygiene often Do NOT share personal items Notify your provider if you are in close contact with someone who has COVID or you develop fever 100.4 or greater, new onset of sneezing, cough, sore throat, shortness of breath or body aches.  If you test positive for Covid or have been in contact with anyone that has tested positive in the last 10 days please notify you surgeon.    Your procedure is scheduled on:  11/06/24  Report to [facility] Main Entrance: [place] entrance where the [employer] is available.   Report to admitting at:5:15 AM  Call this number if you have any questions or problems the morning of surgery [phone]  FOLLOW ANY ADDITIONAL PRE OP INSTRUCTIONS YOU RECEIVED FROM YOUR SURGEON'S OFFICE!!!  Do not eat food after Midnight the night prior to your surgery/procedure.  After Midnight you may have the following liquids until : 4:30 AM DAY OF SURGERY  Clear Liquid Diet Water Black  Coffee (sugar ok, NO MILK/CREAM OR CREAMERS)  Tea (sugar ok, NO MILK/CREAM OR CREAMERS) regular and decaf                             Plain Jell-O  with no fruit (NO RED)                                           Fruit ices (not with fruit pulp, NO RED)                                     Popsicles (NO RED)                                                                  Juice: NO CITRUS JUICES: only apple, WHITE grape, WHITE cranberry Sports drinks like Gatorade or Powerade (NO RED)   The day of surgery:  Drink ONE (1) Pre-Surgery Clear Ensure  at: 4:30 AM the morning of surgery. Drink in one sitting. Do not sip.  This drink was given  to you during your hospital pre-op appointment visit. Nothing else to drink after completing the Pre-Surgery Clear Ensure or G2 : No candy, chewing gum or throat lozenges.    Oral Hygiene is also important to reduce your risk of infection.        Remember - BRUSH YOUR TEETH THE MORNING OF SURGERY WITH YOUR REGULAR TOOTHPASTE  Do NOT smoke after Midnight the night before surgery.  STOP TAKING all Vitamins, Herbs and supplements 1 week before your surgery.   Take ONLY these medicines the morning of surgery with A SIP OF WATER: carvedilol,allopurinol.                 You may not have any metal on your body including hair pins, jewelry, and body piercing  Do not wear lotions, powders, perfumes / cologne, or deodorant  Men may shave face and neck.  Contacts, Hearing Aids, dentures or bridgework may not be worn into surgery. DENTURES WILL BE REMOVED PRIOR TO SURGERY PLEASE DO NOT APPLY Poly grip OR ADHESIVES!!!  You may bring a small overnight bag with you on the day of surgery, only pack items that are not valuable. [facility] IS NOT RESPONSIBLE   FOR VALUABLES THAT ARE LOST OR STOLEN.   Patients discharged on the day of surgery will not be allowed to drive home.  Someone NEEDS to stay with you for the first 24 hours after anesthesia.  Do not bring your  home medications to the hospital. The Pharmacy will dispense medications listed on your medication list to you during your admission in the Hospital.  Special Instructions: Bring a copy of your healthcare power of attorney and living will documents the day of surgery, if you wish to have them scanned into your [facility] Records- EPIC  Please read over the following fact sheets you were given: IF YOU HAVE QUESTIONS ABOUT YOUR PRE-OP INSTRUCTIONS, PLEASE CALL [phone]  PATIENT SIGNATURE_________________________________  NURSE SIGNATURE__________________________________  ________________________________________________________________________  Pre-operative 4 CHG Bath Instructions  DYNA-Hex 4 Chlorhexidine Gluconate 4% Solution Antiseptic 4 fl. oz   You can play a key role in reducing the risk of infection after surgery. Your skin needs to be as free of germs as possible. You can reduce the number of germs on your skin by washing with CHG (chlorhexidine gluconate) soap before surgery. CHG is an antiseptic soap that kills germs and continues to kill germs even after washing.   DO NOT use if you have an allergy to chlorhexidine/CHG or antibacterial soaps. If your skin becomes reddened or irritated, stop using the CHG and notify one of our RNs at   Please shower with the CHG soap starting 4 days before surgery using the following schedule:     Please keep in mind the following:  DO NOT shave, including legs and underarms, starting the day of your first shower.   You may shave your face at any point before/day of surgery.  Place clean sheets on your bed the day you start using CHG soap. Use a clean washcloth (not used since being washed) for each shower. DO NOT sleep with pets once you start using the CHG.  CHG Shower Instructions:  If you choose to wash your hair and private area, wash first with your normal shampoo/soap.  After you use shampoo/soap, rinse your hair and body  thoroughly to remove shampoo/soap residue.  Turn the water OFF and apply about 3 tablespoons (45 ml) of CHG soap to a CLEAN washcloth.  Apply CHG  soap ONLY FROM YOUR NECK DOWN TO YOUR TOES (washing for 3-5 minutes)  DO NOT use CHG soap on face, private areas, open wounds, or sores.  Pay special attention to the area where your surgery is being performed.  If you are having back surgery, having someone wash your back for you may be helpful. Wait 2 minutes after CHG soap is applied, then you may rinse off the CHG soap.  Pat dry with a clean towel  Put on clean clothes/pajamas   If you choose to wear lotion, please use ONLY the CHG-compatible lotions on the back of this paper.     Additional instructions for the day of surgery: DO NOT APPLY any lotions, deodorants, cologne, or perfumes.   Put on clean/comfortable clothes.  Brush your teeth.  Ask your nurse before applying any prescription medications to the skin.   CHG Compatible Lotions   Aveeno Moisturizing lotion  Cetaphil Moisturizing Cream  Cetaphil Moisturizing Lotion  Clairol Herbal Essence Moisturizing Lotion, Dry Skin  Clairol Herbal Essence Moisturizing Lotion, Extra Dry Skin  Clairol Herbal Essence Moisturizing Lotion, Normal Skin  Curel Age Defying Therapeutic Moisturizing Lotion with Alpha Hydroxy  Curel Extreme Care Body Lotion  Curel Soothing Hands Moisturizing Hand Lotion  Curel Therapeutic Moisturizing Cream, Fragrance-Free  Curel Therapeutic Moisturizing Lotion, Fragrance-Free  Curel Therapeutic Moisturizing Lotion, Original Formula  Eucerin Daily Replenishing Lotion  Eucerin Dry Skin Therapy Plus Alpha Hydroxy Crme  Eucerin Dry Skin Therapy Plus Alpha Hydroxy Lotion  Eucerin Original Crme  Eucerin Original Lotion  Eucerin Plus Crme Eucerin Plus Lotion  Eucerin TriLipid Replenishing Lotion  Keri Anti-Bacterial Hand Lotion  Keri Deep Conditioning Original Lotion Dry Skin Formula Softly Scented  Keri Deep  Conditioning Original Lotion, Fragrance Free Sensitive Skin Formula  Keri Lotion Fast Absorbing Fragrance Free Sensitive Skin Formula  Keri Lotion Fast Absorbing Softly Scented Dry Skin Formula  Keri Original Lotion  Keri Skin Renewal Lotion Keri Silky Smooth Lotion  Keri Silky Smooth Sensitive Skin Lotion  Nivea Body Creamy Conditioning Oil  Nivea Body Extra Enriched Lotion  Nivea Body Original Lotion  Nivea Body Sheer Moisturizing Lotion Nivea Crme  Nivea Skin Firming Lotion  NutraDerm 30 Skin Lotion  NutraDerm Skin Lotion  NutraDerm Therapeutic Skin Cream  NutraDerm Therapeutic Skin Lotion  ProShield Protective Hand Cream  Provon moisturizing lotion  Incentive Spirometer  An incentive spirometer is a tool that can help keep your lungs clear and active. This tool measures how well you are filling your lungs with each breath. Taking long deep breaths may help reverse or decrease the chance of developing breathing (pulmonary) problems (especially infection) following: A long period of time when you are unable to move or be active. BEFORE THE PROCEDURE  If the spirometer includes an indicator to show your best effort, your nurse or respiratory therapist will set it to a desired goal. If possible, sit up straight or lean slightly forward. Try not to slouch. Hold the incentive spirometer in an upright position. INSTRUCTIONS FOR USE  Sit on the edge of your bed if possible, or sit up as far as you can in bed or on a chair. Hold the incentive spirometer in an upright position. Breathe out normally. Place the mouthpiece in your mouth and seal your lips tightly around it. Breathe in slowly and as deeply as possible, raising the piston or the ball toward the top of the column. Hold your breath for 3-5 seconds or for as long as possible. Allow the piston  or ball to fall to the bottom of the column. Remove the mouthpiece from your mouth and breathe out normally. Rest for a few seconds and  repeat Steps 1 through 7 at least 10 times every 1-2 hours when you are awake. Take your time and take a few normal breaths between deep breaths. The spirometer may include an indicator to show your best effort. Use the indicator as a goal to work toward during each repetition. After each set of 10 deep breaths, practice coughing to be sure your lungs are clear. If you have an incision (the cut made at the time of surgery), support your incision when coughing by placing a pillow or rolled up towels firmly against it. Once you are able to get out of bed, walk around indoors and cough well. You may stop using the incentive spirometer when instructed by your caregiver.  RISKS AND COMPLICATIONS Take your time so you do not get dizzy or light-headed. If you are in pain, you may need to take or ask for pain medication before doing incentive spirometry. It is harder to take a deep breath if you are having pain. AFTER USE Rest and breathe slowly and easily. It can be helpful to keep track of a log of your progress. Your caregiver can provide you with a simple table to help with this. If you are using the spirometer at home, follow these instructions: SEEK MEDICAL CARE IF:  You are having difficultly using the spirometer. You have trouble using the spirometer as often as instructed. Your pain medication is not giving enough relief while using the spirometer. You develop fever of 100.5 F (38.1 C) or higher. SEEK IMMEDIATE MEDICAL CARE IF:  You cough up bloody sputum that had not been present before. You develop fever of 102 F (38.9 C) or greater. You develop worsening pain at or near the incision site. MAKE SURE YOU:  Understand these instructions. Will watch your condition. Will get help right away if you are not doing well or get worse. Document Released: 02/14/2007 Document Revised: 12/27/2011 Document Reviewed: 04/17/2007 [facility] Patient Information 2014 [place],  [place].   ________________________________________________________________________

## 2024-10-25 ENCOUNTER — Encounter (HOSPITAL_COMMUNITY)
Admission: RE | Admit: 2024-10-25 | Discharge: 2024-10-25 | Disposition: A | Discharge status: Home / Self Care | Source: Ambulatory Visit | Attending: Orthopedic Surgery | Admitting: Orthopedic Surgery

## 2024-10-25 ENCOUNTER — Other Ambulatory Visit: Payer: Self-pay

## 2024-10-25 ENCOUNTER — Encounter (HOSPITAL_COMMUNITY): Payer: Self-pay

## 2024-10-25 ENCOUNTER — Encounter (HOSPITAL_COMMUNITY): Payer: Self-pay | Admitting: Medical

## 2024-10-25 VITALS — BP 215/108 | HR 65 | Temp 98.7°F | Resp 16 | Ht 73.0 in | Wt 202.0 lb

## 2024-10-25 DIAGNOSIS — Z01812 Encounter for preprocedural laboratory examination: Secondary | ICD-10-CM | POA: Diagnosis present

## 2024-10-25 DIAGNOSIS — Z01810 Encounter for preprocedural cardiovascular examination: Secondary | ICD-10-CM | POA: Diagnosis present

## 2024-10-25 DIAGNOSIS — I10 Essential (primary) hypertension: Secondary | ICD-10-CM | POA: Insufficient documentation

## 2024-10-25 DIAGNOSIS — Z01818 Encounter for other preprocedural examination: Secondary | ICD-10-CM | POA: Diagnosis not present

## 2024-10-25 HISTORY — DX: Myoneural disorder, unspecified: G70.9

## 2024-10-25 LAB — BASIC METABOLIC PANEL WITH GFR
Anion gap: 10 (ref 5–15)
BUN: 14 mg/dL (ref 6–20)
CO2: 28 mmol/L (ref 22–32)
Calcium: 9.7 mg/dL (ref 8.9–10.3)
Chloride: 102 mmol/L (ref 98–111)
Creatinine, Ser: 1.1 mg/dL (ref 0.61–1.24)
GFR, Estimated: >60 mL/min
Glucose, Bld: 90 mg/dL (ref 70–99)
Potassium: 4.1 mmol/L (ref 3.5–5.1)
Sodium: 140 mmol/L (ref 135–145)

## 2024-10-25 LAB — CBC
HCT: 43.5 % (ref 39.0–52.0)
Hemoglobin: 14.7 g/dL (ref 13.0–17.0)
MCH: 31.3 pg (ref 26.0–34.0)
MCHC: 33.8 g/dL (ref 30.0–36.0)
MCV: 92.6 fL (ref 80.0–100.0)
Platelets: 176 K/uL (ref 150–400)
RBC: 4.7 MIL/uL (ref 4.22–5.81)
RDW: 13.3 % (ref 11.5–15.5)
WBC: 8.8 K/uL (ref 4.0–10.5)
nRBC: 0 % (ref 0.0–0.2)

## 2024-10-25 LAB — SURGICAL PCR SCREEN
MRSA, PCR: NEGATIVE
Staphylococcus aureus: NEGATIVE

## 2024-10-25 NOTE — Progress Notes (Signed)
 Pt. Arrived for PST appointment. B/P 209/115. Rechecked 30 minutes later and it was 215/108. Pt. Denies feeling dizzy, h/a. Blurred vision. Pt. Reports he is seeing his PCP on Monday to adjust B/P med . The above reported to [employer] PA. Pt. Left without c/o.Attempted to report this to PA Gerard Large (Ortho PA)per her request but she was not in the office today.

## 2024-10-25 NOTE — Progress Notes (Addendum)
 COVID Vaccine received:  [x]  No []  Yes Date of any COVID positive Test in last 90 days: none PCP - Dr. Rosamond MD in [facility] Cardiologist - n/a  Chest x-ray -  EKG -   Stress Test -  ECHO -  Cardiac Cath -   Bowel Prep - [x]  No  []   Yes ______  Pacemaker / ICD device [x]  No []  Yes   Spinal Cord Stimulator:[x]  No []  Yes       History of Sleep Apnea? [x]  No []  Yes   CPAP used?- [x]  No []  Yes    Does the patient monitor blood sugar?          [x]  No []  Yes  []  N/A  Patient has: [x]  NO Hx DM   []  Pre-DM                 []  DM1  []   DM2 Does patient have a [employer] or Dexacom? []  No []  Yes   Fasting Blood Sugar Ranges-  Checks Blood Sugar _____ times a day  GLP1 agonist / usual dose - no GLP1 instructions:  SGLT-2 inhibitors / usual dose - no SGLT-2 instructions:   Blood Thinner / Instructions:no Aspirin Instructions:no  Comments:   Activity level: Patient is able  to climb a flight of stairs without difficulty; [x]  No CP  [x]  No SOB,   Patient can perform ADLs without assistance.   Anesthesia review: Smoker, HTN, Traumatic brain injury, abnl. EKG, B/P significantly high during PST visit. Asymptomatic.  Patient denies shortness of breath, fever, cough and chest pain at PAT appointment.  Patient verbalized understanding and agreement to the Pre-Surgical Instructions that were given to them at this PAT appointment. Patient was also educated of the need to review these PAT instructions again prior to his/her surgery.I reviewed the appropriate phone numbers to call if they have any and questions or concerns.
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
## 2024-11-06 ENCOUNTER — Encounter (HOSPITAL_COMMUNITY): Admission: RE | Payer: Self-pay | Source: Home / Self Care

## 2024-11-06 ENCOUNTER — Ambulatory Visit (HOSPITAL_COMMUNITY): Admission: RE | Admit: 2024-11-06 | Source: Home / Self Care | Admitting: Orthopedic Surgery

## 2024-11-06 SURGERY — ARTHROPLASTY, KNEE, TOTAL
Anesthesia: Spinal | Site: Knee | Laterality: Left

## 2025-01-07 ENCOUNTER — Ambulatory Visit: Admitting: Internal Medicine
